# Patient Record
Sex: Male | Born: 2000 | Race: Black or African American | Hispanic: No | Marital: Single | Smoking: Never smoker
Health system: Southern US, Community
[De-identification: ages and names within clinical notes are randomized; demographics above are authoritative.]

## PROBLEM LIST (undated history)

## (undated) DIAGNOSIS — J45909 Unspecified asthma, uncomplicated: Secondary | ICD-10-CM

## (undated) HISTORY — DX: Unspecified asthma, uncomplicated: J45.909

---
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
## 2022-03-25 ENCOUNTER — Emergency Department (HOSPITAL_COMMUNITY): Payer: Medicaid Other

## 2022-03-25 ENCOUNTER — Other Ambulatory Visit: Payer: Self-pay

## 2022-03-25 ENCOUNTER — Emergency Department (HOSPITAL_COMMUNITY)
Admission: EM | Admit: 2022-03-25 | Discharge: 2022-03-26 | Discharge status: Against Medical Advice | Payer: Medicaid Other | Attending: Physician Assistant | Admitting: Physician Assistant

## 2022-03-25 ENCOUNTER — Encounter (HOSPITAL_COMMUNITY): Payer: Self-pay | Admitting: Emergency Medicine

## 2022-03-25 DIAGNOSIS — M25519 Pain in unspecified shoulder: Secondary | ICD-10-CM | POA: Diagnosis not present

## 2022-03-25 DIAGNOSIS — Y92410 Unspecified street and highway as the place of occurrence of the external cause: Secondary | ICD-10-CM | POA: Insufficient documentation

## 2022-03-25 DIAGNOSIS — M25512 Pain in left shoulder: Secondary | ICD-10-CM | POA: Insufficient documentation

## 2022-03-25 DIAGNOSIS — Z5321 Procedure and treatment not carried out due to patient leaving prior to being seen by health care provider: Secondary | ICD-10-CM | POA: Diagnosis not present

## 2022-03-25 DIAGNOSIS — M542 Cervicalgia: Secondary | ICD-10-CM | POA: Diagnosis not present
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
## 2022-03-26 NOTE — ED Notes (Signed)
Pt family member remove C-collar. Advise to keep on. Pt family member said she going take pt to urgent care in the morning. Pt left AMA
